# Patient Record
Sex: Male | Born: 1945 | Race: White | Hispanic: No | Smoking: Former smoker
Health system: Southern US, Community
[De-identification: ages and names within clinical notes are randomized; demographics above are authoritative.]

## PROBLEM LIST (undated history)

## (undated) HISTORY — PX: HERNIA REPAIR: SHX51

## (undated) HISTORY — PX: JOINT REPLACEMENT: SHX530

---

## 2006-06-23 ENCOUNTER — Emergency Department (HOSPITAL_COMMUNITY): Admission: EM | Admit: 2006-06-23 | Discharge: 2006-06-23 | Payer: Self-pay | Admitting: Emergency Medicine

## 2008-04-06 ENCOUNTER — Encounter: Admission: RE | Admit: 2008-04-06 | Discharge: 2008-04-06 | Payer: Self-pay

## 2011-10-11 ENCOUNTER — Encounter (HOSPITAL_BASED_OUTPATIENT_CLINIC_OR_DEPARTMENT_OTHER): Payer: Self-pay | Admitting: Family Medicine

## 2011-10-11 ENCOUNTER — Emergency Department (HOSPITAL_BASED_OUTPATIENT_CLINIC_OR_DEPARTMENT_OTHER)
Admission: EM | Admit: 2011-10-11 | Discharge: 2011-10-11 | Disposition: A | Discharge status: Home / Self Care | Payer: Worker's Compensation | Attending: Emergency Medicine | Admitting: Emergency Medicine

## 2011-10-11 ENCOUNTER — Emergency Department (INDEPENDENT_AMBULATORY_CARE_PROVIDER_SITE_OTHER): Payer: Worker's Compensation

## 2011-10-11 DIAGNOSIS — Y9289 Other specified places as the place of occurrence of the external cause: Secondary | ICD-10-CM | POA: Insufficient documentation

## 2011-10-11 DIAGNOSIS — M79609 Pain in unspecified limb: Secondary | ICD-10-CM

## 2011-10-11 DIAGNOSIS — W19XXXA Unspecified fall, initial encounter: Secondary | ICD-10-CM

## 2011-10-11 DIAGNOSIS — S7002XA Contusion of left hip, initial encounter: Secondary | ICD-10-CM

## 2011-10-11 DIAGNOSIS — W010XXA Fall on same level from slipping, tripping and stumbling without subsequent striking against object, initial encounter: Secondary | ICD-10-CM | POA: Insufficient documentation

## 2011-10-11 DIAGNOSIS — S7000XA Contusion of unspecified hip, initial encounter: Secondary | ICD-10-CM | POA: Insufficient documentation

## 2011-10-11 DIAGNOSIS — S60229A Contusion of unspecified hand, initial encounter: Secondary | ICD-10-CM | POA: Insufficient documentation

## 2011-10-11 DIAGNOSIS — S60222A Contusion of left hand, initial encounter: Secondary | ICD-10-CM

## 2011-10-11 DIAGNOSIS — M25559 Pain in unspecified hip: Secondary | ICD-10-CM

## 2011-10-11 DIAGNOSIS — Y9329 Activity, other involving ice and snow: Secondary | ICD-10-CM | POA: Insufficient documentation

## 2011-10-11 NOTE — ED Provider Notes (Signed)
History     CSN: [id number]  Arrival date & time 10/11/11  4098   First MD Initiated Contact with Patient 10/11/11 [phone]      Chief Complaint  Patient presents with  . Fall    (Consider location/radiation/quality/duration/timing/severity/associated sxs/prior treatment) HPI Comments: Was getting out of car at work, slipped on ice and fell.  He landed on his left hip (which has been replaced in the past) and right hand.    Patient is a 66 y.o. male presenting with fall. The history is provided by the patient.  Fall The accident occurred less than 1 hour ago. He landed on concrete. There was no blood loss. The point of impact was the left hip. The pain is present in the left hip (right hand). The pain is moderate. He was ambulatory at the scene. Exacerbated by: palpation, movement. He has tried nothing for the symptoms.    History reviewed. No pertinent past medical history.  Past Surgical History  Procedure Date  . Joint replacement   . Hernia repair     No family history on file.  History  Substance Use Topics  . Smoking status: Never Smoker   . Smokeless tobacco: Not on file  . Alcohol Use: No      Review of Systems  All other systems reviewed and are negative.    Allergies  Review of patient's allergies indicates no known allergies.  Home Medications  No current outpatient prescriptions on file.  BP 108/61  Pulse 68  Temp(Src) 97.6 F (36.4 C) (Oral)  Resp 18  Ht 6' (1.829 m)  Wt 220 lb (99.791 kg)  BMI 29.84 kg/m2  SpO2 100%  Physical Exam  Nursing note and vitals reviewed. Constitutional: He is oriented to person, place, and time. He appears well-developed and well-nourished.  HENT:  Head: Normocephalic and atraumatic.  Neck: Normal range of motion. Neck supple.  Musculoskeletal:       There right hand has ttp over the 4th and 5th metacarpals and full range of motion.  He reports pain in the left groin, but the hip appears grossly normal and is  neurovascularly intact distally.  Neurological: He is alert and oriented to person, place, and time.  Skin: Skin is warm and dry.    ED Course  Procedures (including critical care time)  Labs Reviewed - No data to display No results found.   No diagnosis found.    MDM  Xrays show no fracture.  Will give time, follow up prn.        Geoffery Lyons, MD 10/11/11 [phone]

## 2011-10-11 NOTE — ED Notes (Addendum)
Pt sts he fell on ice this morning at work injuring right hand and left hip. Pt sts he has an artificial left hip and wants to make sure "it's ok", pt able to ambulate without difficulty. Rates pain 5/10. Pt denies hitting head, denies neck and back pain.

## 2013-07-19 IMAGING — CR DG HAND COMPLETE 3+V*R*
3 series · 3 of 3 positions shown · non-contrast
Comparison: None.

CLINICAL DATA: Fall.  Right hand pain.

RIGHT HAND - COMPLETE 3+ VIEW

[x hand pa right]
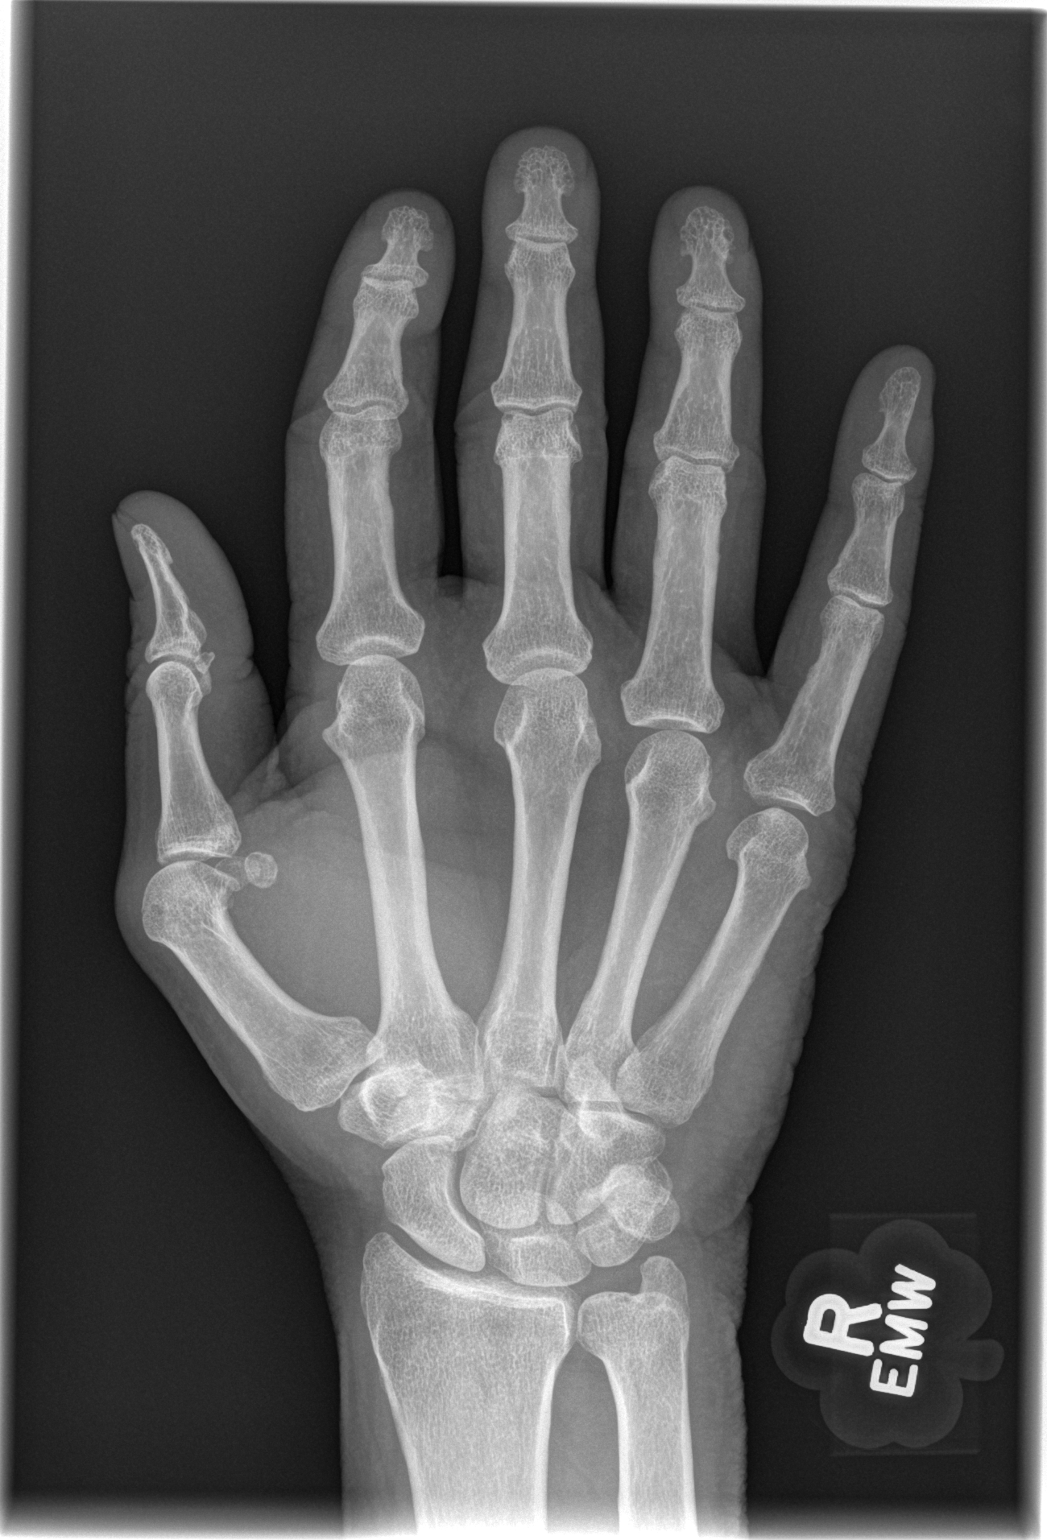

[x hand oblique right]
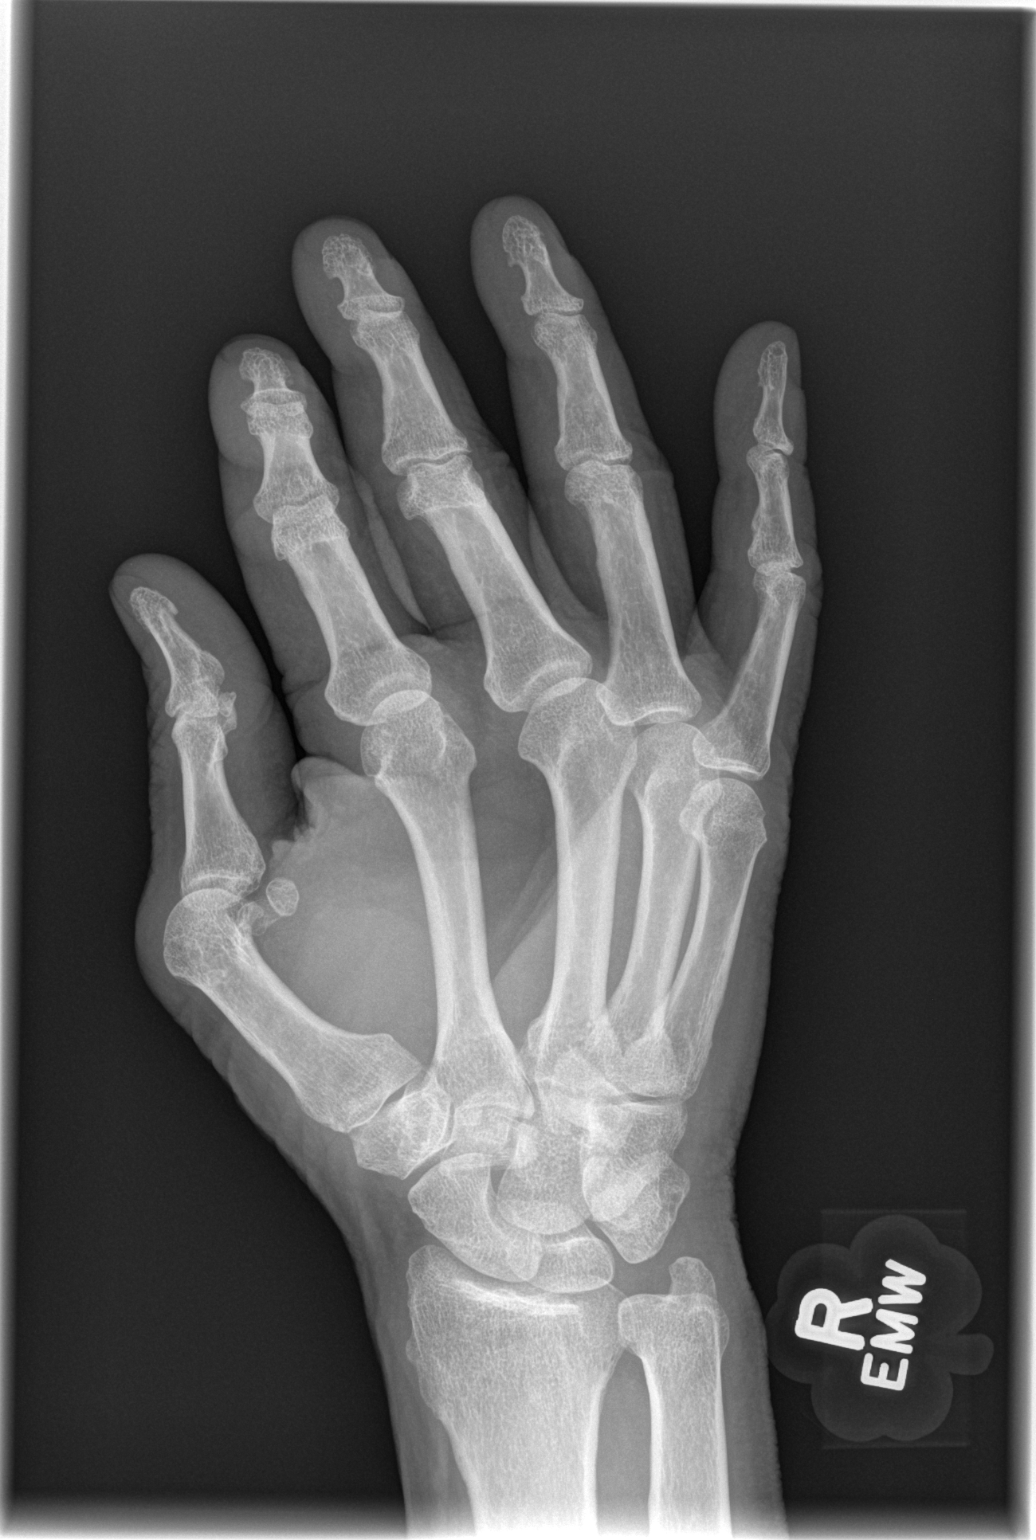

[x hand lat right]
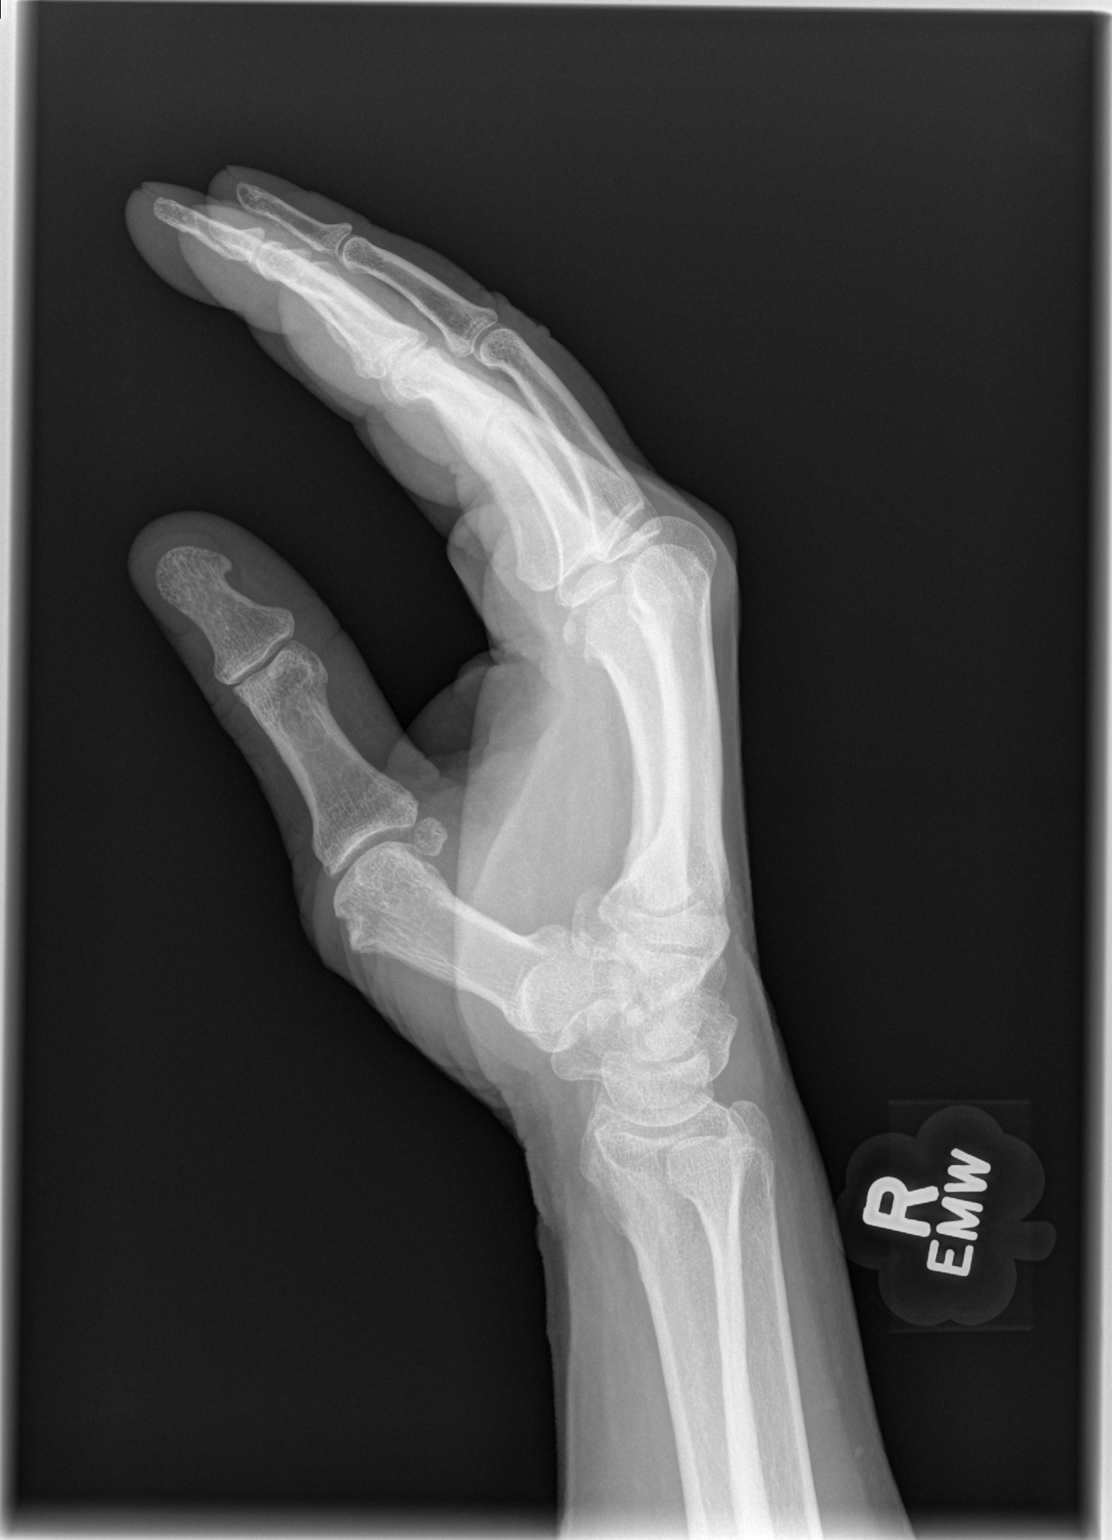

[3 of 3 positions shown; findings below may reference images not displayed]

FINDINGS: Degenerative findings of the first metacarpal phalangeal
joint noted.  No cortical discontinuity is observed to suggest
fracture.

The patient was unable to fan the fingers on the lateral
projection, resulting in superimposition of the fingers and
consequent reduced sensitivity.
IMPRESSION: 1.  Degenerative findings of the first metacarpophalangeal joint.
2.  No acute bony findings.  Reduced sensitivity on the lateral
projection due to patient inability to fan the fingers.  If
symptoms persist despite conservative therapy, MRI followup may be
warranted.

## 2013-07-19 IMAGING — CR DG HIP (WITH OR WITHOUT PELVIS) 2-3V*L*
3 series · 3 of 3 positions shown · non-contrast
Comparison: CT abdomen and pelvis of 04/06/2008

CLINICAL DATA: Fell this morning, left hip pain

LEFT HIP - COMPLETE 2+ VIEW

[t pelvis a.p.]
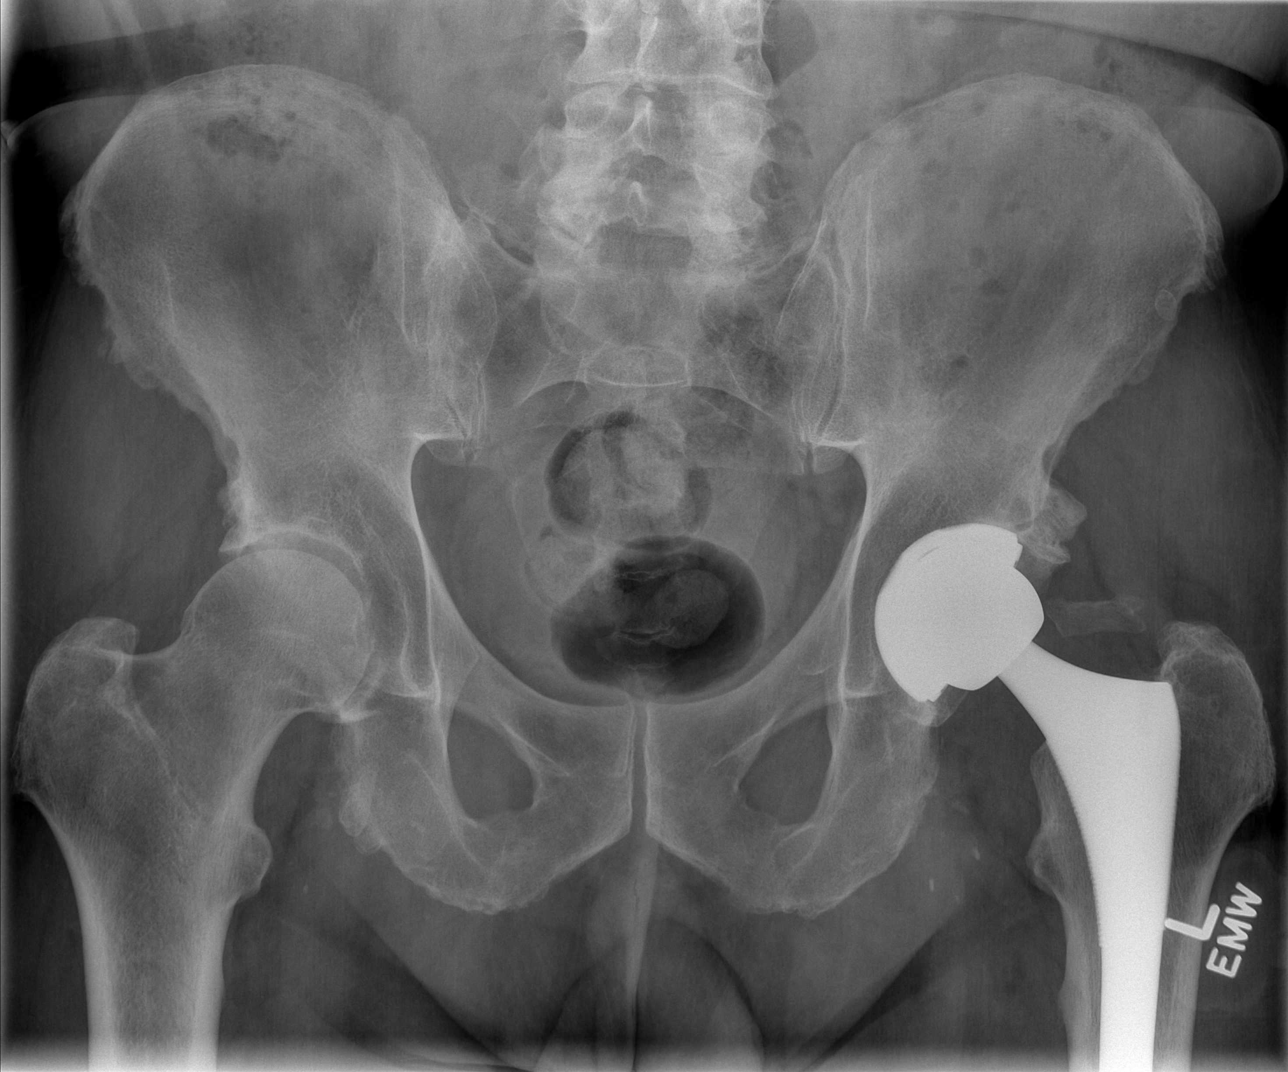

[t hip ap left]
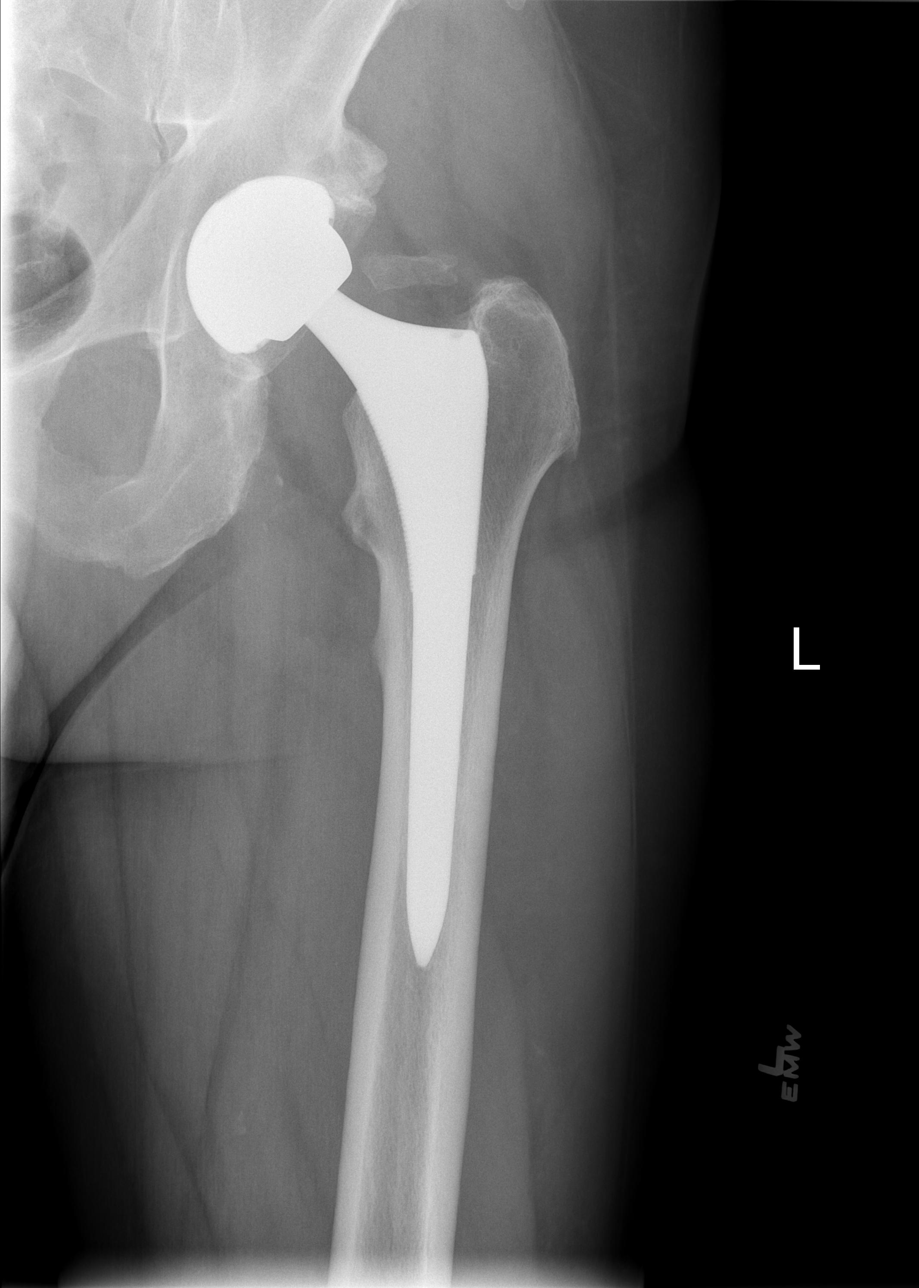

[t hip frog leg left]
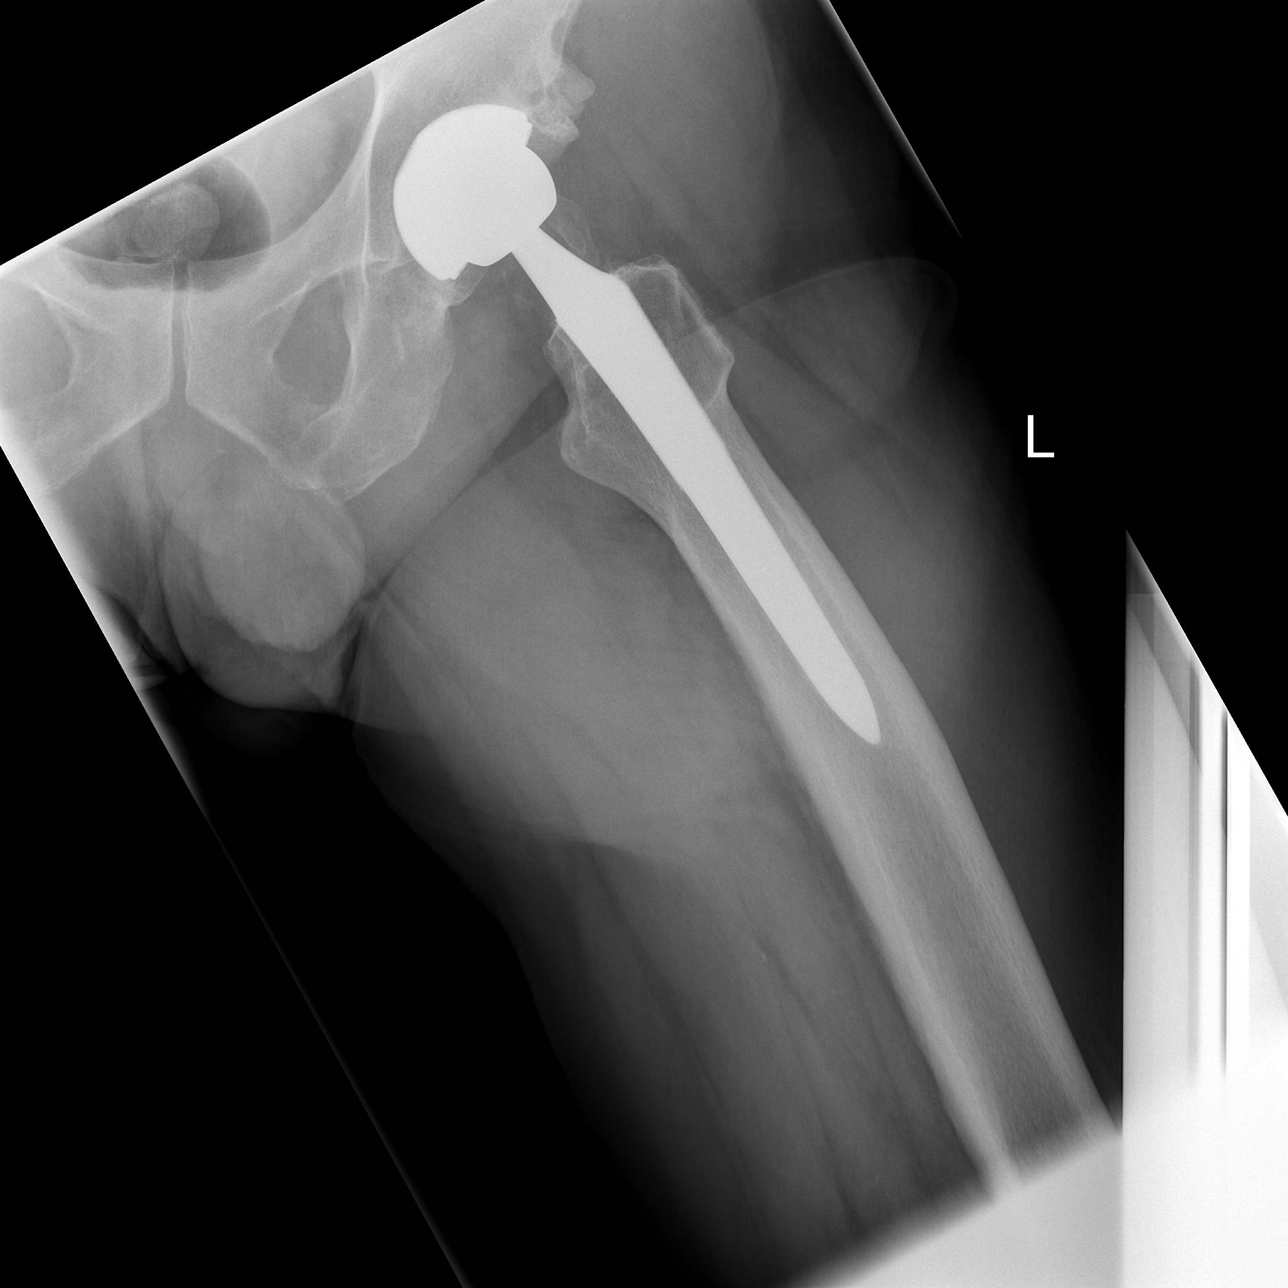

[3 of 3 positions shown; findings below may reference images not displayed]

FINDINGS: A left total hip replacement is present and appears to be
in good position.  There is moderate degenerative joint disease
involving the right hip.  The pelvic rami are intact.  The SI
joints appear normal.  There are degenerative changes in the lower
lumbar spine.
IMPRESSION: Left total hip replacement.  No acute fracture.  Moderate
degenerative change in the right hip.

## 2020-08-11 ENCOUNTER — Other Ambulatory Visit: Payer: Self-pay

## 2020-08-11 ENCOUNTER — Emergency Department (INDEPENDENT_AMBULATORY_CARE_PROVIDER_SITE_OTHER): Payer: Medicare HMO

## 2020-08-11 ENCOUNTER — Emergency Department
Admission: EM | Admit: 2020-08-11 | Discharge: 2020-08-11 | Disposition: A | Discharge status: Home / Self Care | Payer: Medicare HMO | Source: Home / Self Care

## 2020-08-11 DIAGNOSIS — Q790 Congenital diaphragmatic hernia: Secondary | ICD-10-CM

## 2020-08-11 DIAGNOSIS — R1031 Right lower quadrant pain: Secondary | ICD-10-CM | POA: Diagnosis not present

## 2020-08-11 DIAGNOSIS — I700 Atherosclerosis of aorta: Secondary | ICD-10-CM

## 2020-08-11 DIAGNOSIS — K5730 Diverticulosis of large intestine without perforation or abscess without bleeding: Secondary | ICD-10-CM | POA: Diagnosis not present

## 2020-08-11 DIAGNOSIS — N200 Calculus of kidney: Secondary | ICD-10-CM | POA: Diagnosis not present

## 2020-08-11 DIAGNOSIS — K769 Liver disease, unspecified: Secondary | ICD-10-CM

## 2020-08-11 DIAGNOSIS — K429 Umbilical hernia without obstruction or gangrene: Secondary | ICD-10-CM

## 2020-08-11 LAB — POCT CBC W AUTO DIFF (K'VILLE URGENT CARE)

## 2020-08-11 LAB — I-STAT CREATININE (MANUAL ENTRY): Creatinine, Ser: 1.2 — AB (ref 0.50–1.10)

## 2020-08-11 MED ORDER — IOHEXOL 300 MG/ML  SOLN
100.0000 mL | Freq: Once | INTRAMUSCULAR | Status: AC | PRN
  Administered 2020-08-11: 100 mL via INTRAVENOUS

## 2020-08-11 NOTE — ED Triage Notes (Signed)
Pt presents to Urgent Care with c/o RLQ pain x approx 6 weeks that is accompanied by an occasional "lump" to umbilical area that he can "press back in." Pt states pain was more severe last night.  Pt reports hx of multiple hernia repairs. Denies N/V.

## 2020-08-11 NOTE — ED Provider Notes (Signed)
[facility]    CSN: [id number] Arrival date & time: 08/11/20  1126      History   Chief Complaint Chief Complaint  Patient presents with  . Abdominal Pain    HPI Alfred Ortiz is a 74 y.o. male.   HPI  Alfred Ortiz is a 73 y.o. male presenting to UC with c/o intermittent RLQ abdominal pain for about 6 weeks. Associated "lump" in umbilical area that hs can "press back in."  Last night, pain was more severe, he almost wen to the hospital because the "lump" felt more firm than usual.  Hx of at least 4 hernia repairs on his abdomen, last repair was about 10 years ago. Denies fever, chills, n/v/d.    History reviewed. No pertinent past medical history.  There are no problems to display for this patient.   Past Surgical History:  Procedure Laterality Date  . HERNIA REPAIR    . JOINT REPLACEMENT         Home Medications    Prior to Admission medications   Medication Sig Start Date End Date Taking? Authorizing Provider  Ascorbic Acid (VITAMIN C) 1000 MG tablet Take 1,000 mg by mouth daily.   Yes [provider]  Probiotic Product (CULTURELLE PROBIOTICS PO) Take by mouth.   Yes [provider]  BLACK CURRANT SEED OIL PO Take by mouth.    [provider]    Family History Family History  Problem Relation Age of Onset  . Emphysema Mother   . Alcohol abuse Father     Social History Social History   Tobacco Use  . Smoking status: Former Games developer  . Smokeless tobacco: Never Used  . Tobacco comment: quit 30 years ago  Substance Use Topics  . Alcohol use: No  . Drug use: No     Allergies   Patient has no known allergies.   Review of Systems Review of Systems  Constitutional: Negative for appetite change, chills and fever.  Gastrointestinal: Positive for abdominal pain. Negative for blood in stool, constipation, diarrhea, nausea and vomiting.  Musculoskeletal: Positive for back pain (chronic).     Physical  Exam Triage Vital Signs ED Triage Vitals [08/11/20 1154]  Enc Vitals Group     BP (!) 153/79     Pulse Rate 66     Resp 18     Temp 98.1 F (36.7 C)     Temp Source Oral     SpO2 100 %     Weight      Height      Head Circumference      Peak Flow      Pain Score      Pain Loc      Pain Edu?      Excl. in GC?    No data found.  Updated Vital Signs BP (!) 153/79 (BP Location: Right Arm)   Pulse 66   Temp 98.1 F (36.7 C) (Oral)   Resp 18   SpO2 100%   Visual Acuity Right Eye Distance:   Left Eye Distance:   Bilateral Distance:    Right Eye Near:   Left Eye Near:    Bilateral Near:     Physical Exam Vitals and nursing note reviewed.  Constitutional:      General: He is not in acute distress.    Appearance: He is well-developed. He is not ill-appearing, toxic-appearing or diaphoretic.  HENT:     Head: Normocephalic and atraumatic.  Cardiovascular:  Rate and Rhythm: Normal rate and regular rhythm.  Pulmonary:     Effort: Pulmonary effort is normal. No respiratory distress.     Breath sounds: Normal breath sounds.  Abdominal:     Tenderness: There is abdominal tenderness in the right lower quadrant and periumbilical area.     Hernia: A hernia is present. Hernia is present in the umbilical area ( soft, easily reducible. ).     Comments: Abdominal exam complicated by back pain worsening when pt laid flat for abdominal exam. More pronounced RLQ abdominal tenderness when pt in seated position without palpable mass.   Musculoskeletal:        General: Normal range of motion.     Cervical back: Normal range of motion.  Skin:    General: Skin is warm and dry.  Neurological:     Mental Status: He is alert and oriented to person, place, and time.  Psychiatric:        Behavior: Behavior normal.      UC Treatments / Results  Labs (all labs ordered are listed, but only abnormal results are displayed) Labs Reviewed  COMPLETE METABOLIC PANEL WITH GFR  POCT CBC  W AUTO DIFF ([facility])    EKG   Radiology CT ABDOMEN PELVIS W CONTRAST  Result Date: 08/11/2020 CLINICAL DATA:  RIGHT lower quadrant abdominal pain EXAM: CT ABDOMEN AND PELVIS WITH CONTRAST TECHNIQUE: Multidetector CT imaging of the abdomen and pelvis was performed using the standard protocol following bolus administration of intravenous contrast. CONTRAST:  OMNIPAQUE IOHEXOL 300 MG/ML  SOLN COMPARISON:  April 06, 2008 FINDINGS: Lower chest: Basilar atelectasis.  No effusion.  No consolidation. Hepatobiliary: No focal, suspicious hepatic lesion. Low-density lesions in the liver, well-circumscribed and water density, largest in the LEFT hepatic lobe at 14 mm likely cysts. Vague area of low attenuation is however noted in the posterior RIGHT hepatic lobe on image 14 of series 2) this was not definitely seen on prior imaging and measures 54 Hounsfield units. No pericholecystic stranding. Common bile duct at 8 mm, was nondilated on previous imaging evaluations but was top-normal size previously. Pancreas: Pancreas normal without ductal dilation or sign of inflammation. Spleen: Spleen normal size and contour. Adrenals/Urinary Tract: Adrenal glands are normal. Symmetric renal enhancement. No hydronephrosis. Urinary bladder grossly unremarkable but with limited assessment secondary to streak artifact from LEFT hip arthroplasty. Lower pole calculus in the LEFT kidney measuring 6 mm. No ureteral calculi. Small low-density lesion upper pole LEFT kidney likely a small cyst. Stomach/Bowel: Small hiatal hernia. Stomach under distended limiting assessment. No acute small bowel process. Appendix not visualized, no secondary signs of acute appendicitis. Extensive pancolonic diverticulosis worse in the sigmoid colon. Colon is stool filled. Vascular/Lymphatic: Calcified atheromatous plaque of the abdominal aorta. Circumaortic LEFT renal vein. There is no gastrohepatic or hepatoduodenal ligament  lymphadenopathy. No retroperitoneal or mesenteric lymphadenopathy. No pelvic lymphadenopathy. Reproductive: Prostate grossly normal. Streak artifact limits assessment of pelvic structures due to LEFT hip arthroplasty. Other: Moderate-sized Bochdalek's hernia with herniation of fat and some vessels into the posterior LEFT chest. No stranding about this area. Musculoskeletal: No acute musculoskeletal process. Spinal degenerative changes. IMPRESSION: 1. No acute findings in the abdomen or pelvis. 2. Extensive pancolonic diverticulosis without evidence of acute diverticulitis. 3. Vague area of low attenuation in the posterior RIGHT hepatic lobe was not definitely seen on prior imaging and measures 54 Hounsfield units. This is indeterminate, not present on previous exam. MRI follow-up in 3-6 months may be helpful,  perhaps with MRCP given the biliary duct distension. MRCP could be performed sooner if there are symptoms that would suggest biliary colic or laboratory abnormalities to suggest biliary obstruction. 4. Nonobstructive LEFT nephrolithiasis. 5. Moderate-sized Bochdalek's hernia with herniation of fat and some vessels into the posterior LEFT chest. No stranding about this area. 6. Aortic atherosclerosis. Aortic Atherosclerosis (ICD10-I70.0). Electronically Signed   By: Donzetta Kohut M.D.   On: 08/11/2020 15:46    Procedures Procedures (including critical care time)  Medications Ordered in UC Medications - No data to display  Initial Impression / Assessment and Plan / UC Course  I have reviewed the triage vital signs and the nursing notes.  Pertinent labs & imaging results that were available during my care of the patient were reviewed by me and considered in my medical decision making (see chart for details).     Discussed imaging with pt No acute abnormality, specifically no appendicitis Encouraged f/u with PCP and general surgery Discussed symptoms that warrant emergent care in the ED. AVS  given  Final Clinical Impressions(s) / UC Diagnoses   Final diagnoses:  RLQ abdominal pain  Umbilical hernia without obstruction and without gangrene  Liver lesion, right lobe  Bochdalek hernia  Aortic atherosclerosis (HCC)  Left nephrolithiasis     Discharge Instructions      Call to schedule a follow up appointment with general surgery for further evaluation and treatment of your symptoms.  If you develop worsening pain again, call 911 or go to the emergency department for further evaluation and treatment.     ED Prescriptions    None     PDMP not reviewed this encounter.   Lurene Shadow, [place] 08/11/20 1943

## 2020-08-11 NOTE — Discharge Instructions (Addendum)
  Call to schedule a follow up appointment with general surgery for further evaluation and treatment of your symptoms.  If you develop worsening pain again, call 911 or go to the emergency department for further evaluation and treatment.

## 2020-08-12 LAB — COMPLETE METABOLIC PANEL WITH GFR
AG Ratio: 1.1 (calc) (ref 1.0–2.5)
ALT: 13 U/L (ref 9–46)
AST: 19 U/L (ref 10–35)
Albumin: 4 g/dL (ref 3.6–5.1)
Alkaline phosphatase (APISO): 55 U/L (ref 35–144)
BUN: 16 mg/dL (ref 7–25)
CO2: 27 mmol/L (ref 20–32)
Calcium: 9.5 mg/dL (ref 8.6–10.3)
Chloride: 98 mmol/L (ref 98–110)
Creat: 1.09 mg/dL (ref 0.70–1.18)
GFR, Est African American: 77 mL/min/{1.73_m2} (ref >=60)
GFR, Est Non African American: 66 mL/min/{1.73_m2} (ref >=60)
Globulin: 3.5 g/dL (calc) (ref 1.9–3.7)
Glucose, Bld: 95 mg/dL (ref 65–99)
Potassium: 4.2 mmol/L (ref 3.5–5.3)
Sodium: 137 mmol/L (ref 135–146)
Total Bilirubin: 1.2 mg/dL (ref 0.2–1.2)
Total Protein: 7.5 g/dL (ref 6.1–8.1)
